# Patient Record
Sex: Female | Born: 2000 | Race: Black or African American | Hispanic: No | Marital: Single | Smoking: Former smoker
Health system: Southern US, Community
[De-identification: ages and names within clinical notes are randomized; demographics above are authoritative.]

## PROBLEM LIST (undated history)

## (undated) DIAGNOSIS — F32A Depression, unspecified: Secondary | ICD-10-CM

## (undated) DIAGNOSIS — F319 Bipolar disorder, unspecified: Secondary | ICD-10-CM

## (undated) DIAGNOSIS — F909 Attention-deficit hyperactivity disorder, unspecified type: Secondary | ICD-10-CM

## (undated) HISTORY — PX: WISDOM TOOTH EXTRACTION: SHX21

---
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
## 2019-10-01 ENCOUNTER — Other Ambulatory Visit: Payer: Self-pay

## 2019-10-01 ENCOUNTER — Ambulatory Visit (HOSPITAL_COMMUNITY)
Admission: EM | Admit: 2019-10-01 | Discharge: 2019-10-01 | Disposition: A | Discharge status: Home / Self Care | Payer: Medicaid Other | Attending: Family Medicine | Admitting: Family Medicine

## 2019-10-01 ENCOUNTER — Encounter (HOSPITAL_COMMUNITY): Payer: Self-pay

## 2019-10-01 ENCOUNTER — Ambulatory Visit (INDEPENDENT_AMBULATORY_CARE_PROVIDER_SITE_OTHER): Payer: Medicaid Other

## 2019-10-01 DIAGNOSIS — Z419 Encounter for procedure for purposes other than remedying health state, unspecified: Secondary | ICD-10-CM | POA: Diagnosis not present

## 2019-10-01 DIAGNOSIS — Z01818 Encounter for other preprocedural examination: Secondary | ICD-10-CM

## 2019-10-01 NOTE — ED Provider Notes (Signed)
[facility]    CSN: [id number] Arrival date & time: 10/01/19  1050      History   Chief Complaint Chief Complaint  Patient presents with  . Chest X-Ray & EKG for Surgery    HPI Valerie Barnett is a 19 y.o. female.   Patient is a 19 year old female presents today for surgical pretesting with a EKG and chest x-ray. She reports she is having a [place] butt lift done. Currently feeling well and has no complaints     History reviewed. No pertinent past medical history.  There are no problems to display for this patient.   Past Surgical History:  Procedure Laterality Date  . WISDOM TOOTH EXTRACTION      OB History   No obstetric history on file.      Home Medications    Prior to Admission medications   Not on File    Family History Family History  Problem Relation Age of Onset  . Healthy Mother   . Healthy Father     Social History Social History   Tobacco Use  . Smoking status: Former Smoker    Types: E-cigarettes  Substance Use Topics  . Alcohol use: Not on file  . Drug use: Not on file     Allergies   Patient has no known allergies.   Review of Systems Review of Systems   Physical Exam Triage Vital Signs ED Triage Vitals  Enc Vitals Group     BP 10/01/19 1120 (!) 121/58     Pulse Rate 10/01/19 1120 88     Resp 10/01/19 1120 17     Temp 10/01/19 1120 98.8 F (37.1 C)     Temp Source 10/01/19 1120 Oral     SpO2 10/01/19 1120 97 %     Weight --      Height --      Head Circumference --      Peak Flow --      Pain Score 10/01/19 1124 0     Pain Loc --      Pain Edu? --      Excl. in GC? --    No data found.  Updated Vital Signs BP (!) 121/58 (BP Location: Right Arm)   Pulse 88   Temp 98.8 F (37.1 C) (Oral)   Resp 17   SpO2 97%   Visual Acuity Right Eye Distance:   Left Eye Distance:   Bilateral Distance:    Right Eye Near:   Left Eye Near:    Bilateral Near:     Physical Exam Vitals and nursing note  reviewed.  Constitutional:      General: She is not in acute distress.    Appearance: Normal appearance. She is not ill-appearing, toxic-appearing or diaphoretic.  HENT:     Head: Normocephalic.     Nose: Nose normal.  Eyes:     Conjunctiva/sclera: Conjunctivae normal.  Pulmonary:     Effort: Pulmonary effort is normal.  Musculoskeletal:        General: Normal range of motion.     Cervical back: Normal range of motion.  Skin:    General: Skin is warm and dry.     Findings: No rash.  Neurological:     Mental Status: She is alert.  Psychiatric:        Mood and Affect: Mood normal.      UC Treatments / Results  Labs (all labs ordered are listed, but only abnormal results are displayed)  Labs Reviewed - No data to display  EKG   Radiology DG Chest 2 View  Result Date: 10/01/2019 CLINICAL DATA:  Preoperative exam. EXAM: CHEST - 2 VIEW COMPARISON:  None. FINDINGS: The heart size and mediastinal contours are within normal limits. Both lungs are clear. The visualized skeletal structures are unremarkable. IMPRESSION: Normal chest x-rays. Electronically Signed   By: Davina Poke D.O.   On: 10/01/2019 12:34    Procedures Procedures (including critical care time)  Medications Ordered in UC Medications - No data to display  Initial Impression / Assessment and Plan / UC Course  I have reviewed the triage vital signs and the nursing notes.  Pertinent labs & imaging results that were available during my care of the patient were reviewed by me and considered in my medical decision making (see chart for details).    EKG and chest x ray for elective surgery EKG within normal sinus rhythm and normal rate. Chest x-ray normal Results on my chart  Final Clinical Impressions(s) / UC Diagnoses   Final diagnoses:  Surgery, elective     Discharge Instructions     Your EKG and chest x-ray were normal   ED Prescriptions    None     PDMP not reviewed this encounter.     Orvan July, NP 10/01/19 1249

## 2019-10-01 NOTE — ED Triage Notes (Signed)
Pt presents for chest x ray and EKG for cosmetic surgery she will be having.

## 2019-10-01 NOTE — Discharge Instructions (Addendum)
Your EKG and chest x-ray were normal

## 2020-10-26 IMAGING — DX DG CHEST 2V
2 series · 2 of 2 positions shown · non-contrast
Comparison: None.

CLINICAL DATA: Preoperative exam.

EXAM:
CHEST - 2 VIEW

[chest pa]
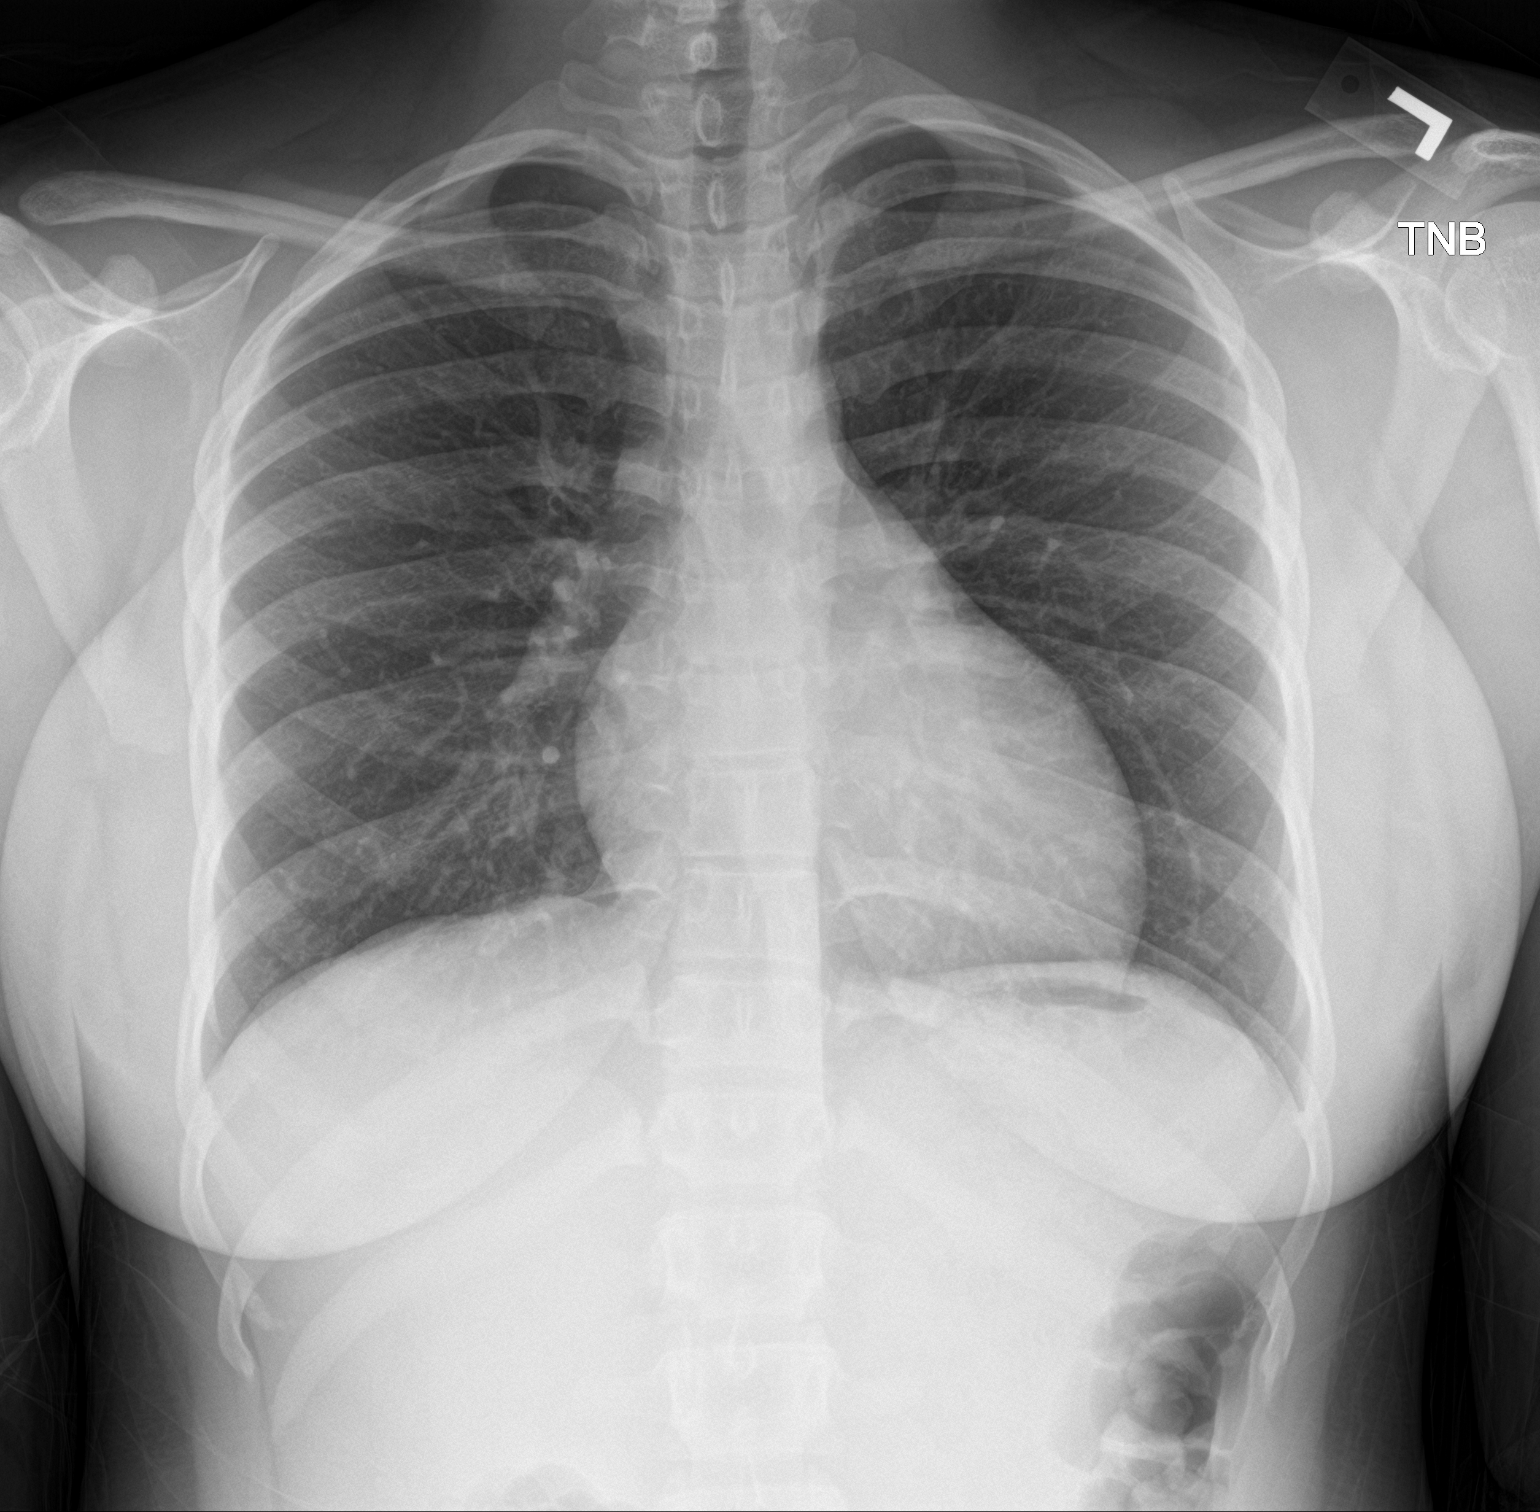

[chest lat]
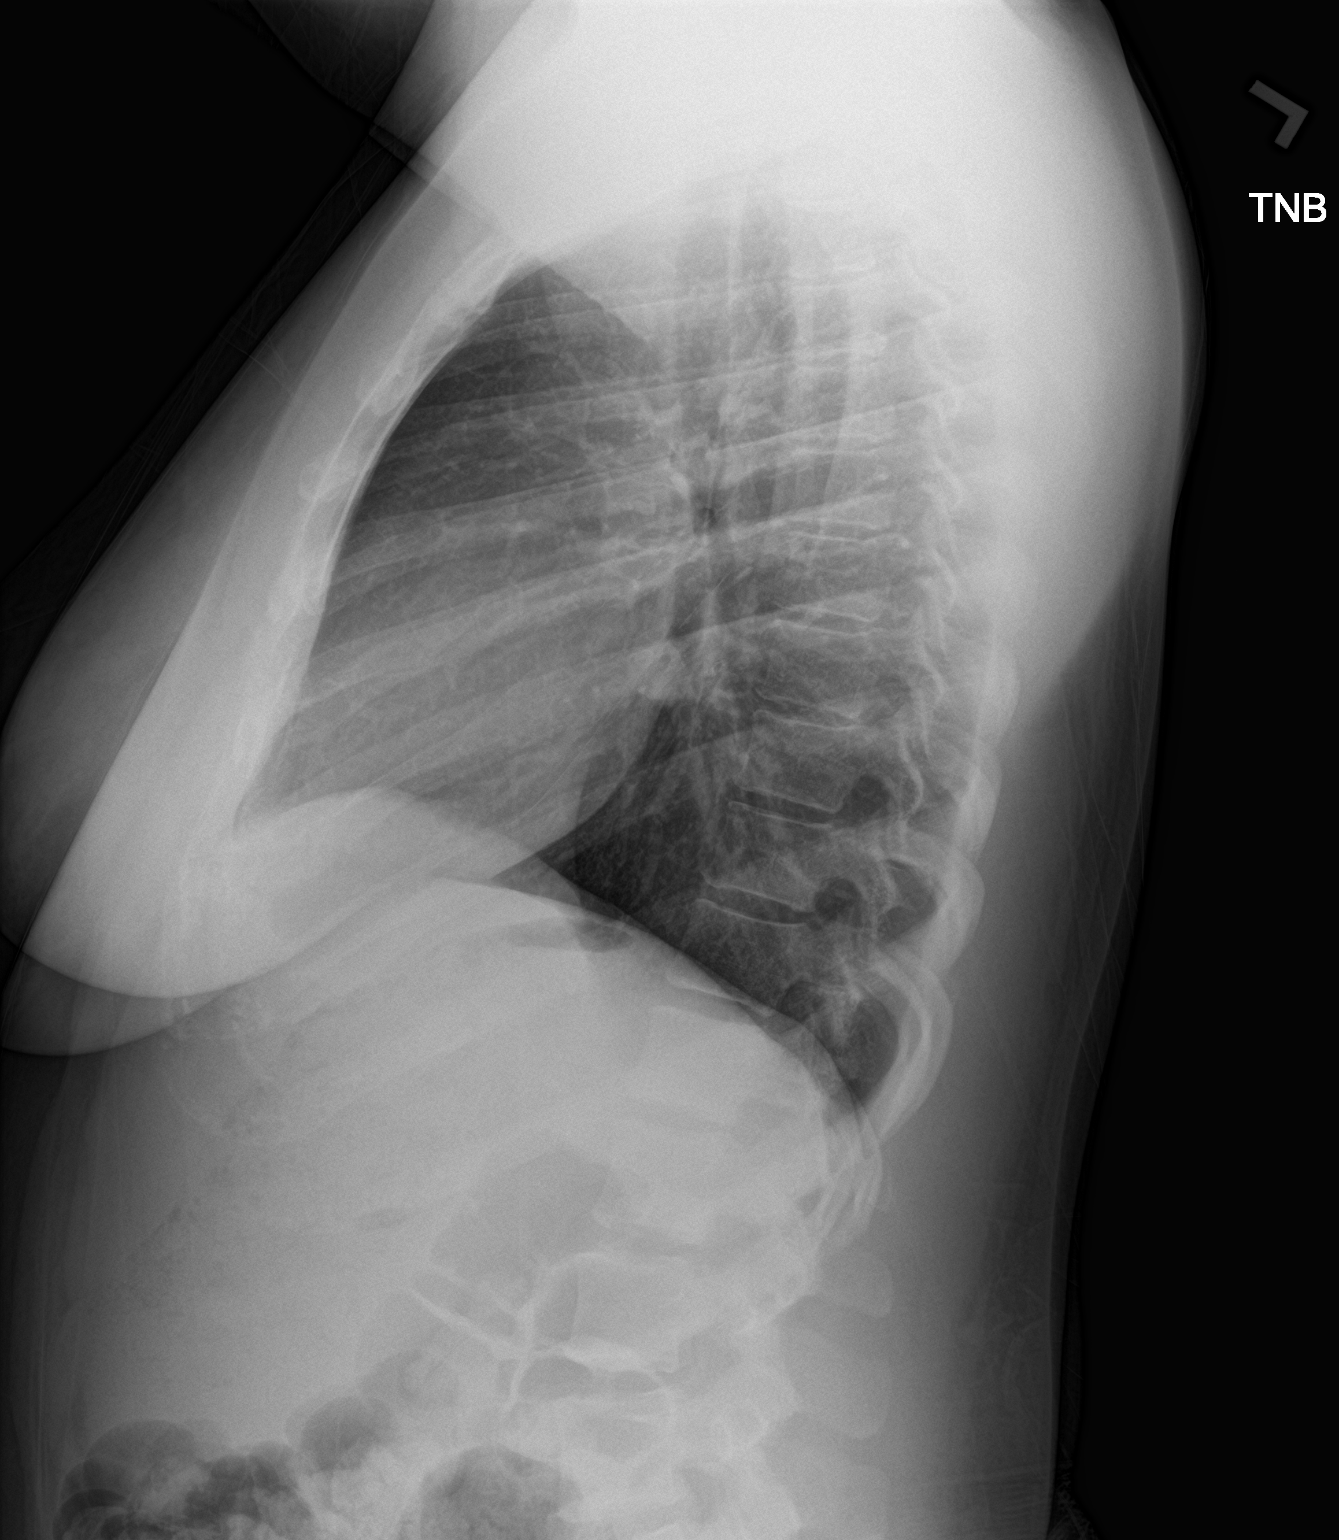

[2 of 2 positions shown; findings below may reference images not displayed]

FINDINGS: The heart size and mediastinal contours are within normal limits.
Both lungs are clear. The visualized skeletal structures are
unremarkable.
IMPRESSION: Normal chest x-rays.

## 2021-05-24 ENCOUNTER — Other Ambulatory Visit: Payer: Self-pay

## 2021-05-24 ENCOUNTER — Ambulatory Visit
Admission: EM | Admit: 2021-05-24 | Discharge: 2021-05-24 | Disposition: A | Discharge status: Home / Self Care | Payer: Medicaid Other

## 2021-05-24 ENCOUNTER — Encounter: Payer: Self-pay | Admitting: Emergency Medicine

## 2021-05-24 DIAGNOSIS — L0291 Cutaneous abscess, unspecified: Secondary | ICD-10-CM

## 2021-05-24 HISTORY — DX: Depression, unspecified: F32.A

## 2021-05-24 HISTORY — DX: Attention-deficit hyperactivity disorder, unspecified type: F90.9

## 2021-05-24 HISTORY — DX: Bipolar disorder, unspecified: F31.9

## 2021-05-24 MED ORDER — KETOROLAC TROMETHAMINE 30 MG/ML IJ SOLN
30.0000 mg | Freq: Once | INTRAMUSCULAR | Status: AC
  Administered 2021-05-24: 30 mg via INTRAMUSCULAR

## 2021-05-24 MED ORDER — DOXYCYCLINE HYCLATE 100 MG PO CAPS: 100.0000 mg | ORAL_CAPSULE | Freq: Two times a day (BID) | ORAL | 0 refills | Status: AC

## 2021-05-24 NOTE — ED Provider Notes (Signed)
[facility]    CSN: [id number] Arrival date & time: 05/24/21  1136      History   Chief Complaint Chief Complaint  Patient presents with   Abscess    HPI Valerie Barnett is a 21 y.o. female.   Here today for evaluation of possible cyst versus abscess to her right leg.  She reports this has been present for a while, and she has appoint with dermatology in 2 weeks.  She states that since she was seen by her PCP the area has gotten larger in size and become more painful.  She has not had fever.  She does not report nausea or vomiting.  She has tried warm soaks without significant relief.  The history is provided by the patient.  Abscess Associated symptoms: no fever, no nausea and no vomiting    Past Medical History:  Diagnosis Date   ADHD    Bipolar 1 disorder (HCC)    Depression     There are no problems to display for this patient.   Past Surgical History:  Procedure Laterality Date   WISDOM TOOTH EXTRACTION      OB History   No obstetric history on file.      Home Medications    Prior to Admission medications   Medication Sig Start Date End Date Taking? Authorizing Provider  amphetamine-dextroamphetamine (ADDERALL) 10 MG tablet Take 10 mg by mouth daily. 05/18/21  Yes [provider]  doxycycline (VIBRAMYCIN) 100 MG capsule Take 1 capsule (100 mg total) by mouth 2 (two) times daily. 05/24/21  Yes Tomi Bamberger, PA-C  FLUoxetine (PROZAC) 40 MG capsule Take 40 mg by mouth daily. 04/28/21  Yes [provider]  hydrOXYzine (ATARAX) 25 MG tablet Take 25 mg by mouth 2 (two) times daily. 12/02/20  Yes [provider]  meloxicam (MOBIC) 7.5 MG tablet Take 7.5 mg by mouth daily. 04/26/21  Yes [provider]  methocarbamol (ROBAXIN) 500 MG tablet Take by mouth. 04/26/21  Yes [provider]  MICROGESTIN 1.5-30 MG-MCG tablet Take 1 tablet by mouth daily. 05/19/21  Yes [provider]  Vitamin D, Ergocalciferol,  (DRISDOL) 1.25 MG (50000 UNIT) CAPS capsule Take 50,000 Units by mouth once a week. 03/28/21  Yes [provider]  VRAYLAR 1.5 MG capsule Take 1.5 mg by mouth daily. 04/28/21  Yes [provider]  triamcinolone cream (KENALOG) 0.1 % Apply topically 2 (two) times daily as needed. 03/01/21   [provider]    Family History Family History  Problem Relation Age of Onset   Healthy Mother    Healthy Father     Social History Social History   Tobacco Use   Smoking status: Former    Types: E-cigarettes     Allergies   Patient has no known allergies.   Review of Systems Review of Systems  Constitutional:  Negative for chills and fever.  Eyes:  Negative for discharge and redness.  Gastrointestinal:  Negative for nausea and vomiting.  Skin:  Positive for color change and wound.    Physical Exam Triage Vital Signs ED Triage Vitals  Enc Vitals Group     BP 05/24/21 1218 (!) 162/77     Pulse Rate 05/24/21 1218 95     Resp 05/24/21 1218 16     Temp 05/24/21 1218 98.7 F (37.1 C)     Temp Source 05/24/21 1218 Oral     SpO2 05/24/21 1218 97 %     Weight --  Height --      Head Circumference --      Peak Flow --      Pain Score 05/24/21 1219 4     Pain Loc --      Pain Edu? --      Excl. in GC? --    No data found.  Updated Vital Signs BP (!) 162/77 (BP Location: Left Arm)    Pulse 95    Temp 98.7 F (37.1 C) (Oral)    Resp 16    SpO2 97%      Physical Exam Vitals and nursing note reviewed.  Constitutional:      General: She is not in acute distress.    Appearance: Normal appearance. She is not ill-appearing.  HENT:     Head: Normocephalic and atraumatic.  Eyes:     Conjunctiva/sclera: Conjunctivae normal.  Cardiovascular:     Rate and Rhythm: Normal rate.  Pulmonary:     Effort: Pulmonary effort is normal.  Skin:    Comments: Approx 2 cm area of induration noted to right upper inner thigh distal to labia, wound present to center   Neurological:     Mental Status: She is alert.  Psychiatric:        Mood and Affect: Mood normal.        Behavior: Behavior normal.        Thought Content: Thought content normal.     UC Treatments / Results  Labs (all labs ordered are listed, but only abnormal results are displayed) Labs Reviewed - No data to display  EKG   Radiology No results found.  Procedures Procedures (including critical care time)  Medications Ordered in UC Medications  ketorolac (TORADOL) 30 MG/ML injection 30 mg (30 mg Intramuscular Given 05/24/21 1233)    Initial Impression / Assessment and Plan / UC Course  I have reviewed the triage vital signs and the nursing notes.  Pertinent labs & imaging results that were available during my care of the patient were reviewed by me and considered in my medical decision making (see chart for details).    I&D deferred today given wound noted to abscess, recommended continued warm soaks to promote spontaneous drainage and will treat with doxycycline.  Encourage patient to keep appointment with dermatology as scheduled.  Patient requests medication for pain and Toradol injection administered in office.  Encouraged follow-up if symptoms fail to improve or worsen before she is seen by dermatology.   Final Clinical Impressions(s) / UC Diagnoses   Final diagnoses:  Abscess   Discharge Instructions   None    ED Prescriptions     Medication Sig Dispense Auth. Provider   doxycycline (VIBRAMYCIN) 100 MG capsule Take 1 capsule (100 mg total) by mouth 2 (two) times daily. 20 capsule Tomi Bamberger, PA-C      PDMP not reviewed this encounter.   Tomi Bamberger, PA-C 05/24/21 1243

## 2021-05-24 NOTE — ED Triage Notes (Signed)
Has a cyst on her right leg, has appt with dermatologist to be evaluated for cyst as referred from PCP, to be seen on 2/28. But she said it's gotten bigger and the pain in her leg is so bad that she's having a hard time walking, sleeping, sitting.

## 2023-11-23 ENCOUNTER — Institutional Professional Consult (permissible substitution)

## 2024-01-11 ENCOUNTER — Institutional Professional Consult (permissible substitution): Admitting: Plastic Surgery
